# Patient Record
Sex: Female | Born: 2000 | Race: White | Hispanic: No | Marital: Single | Smoking: Never smoker
Health system: Southern US, Community
[De-identification: ages and names within clinical notes are randomized; demographics above are authoritative.]

---
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
## 2020-08-26 ENCOUNTER — Other Ambulatory Visit: Payer: Self-pay

## 2020-08-26 ENCOUNTER — Ambulatory Visit
Admission: EM | Admit: 2020-08-26 | Discharge: 2020-08-26 | Disposition: A | Discharge status: Home / Self Care | Payer: Medicaid Other | Attending: Emergency Medicine | Admitting: Emergency Medicine

## 2020-08-26 DIAGNOSIS — S91331A Puncture wound without foreign body, right foot, initial encounter: Secondary | ICD-10-CM

## 2020-08-26 MED ORDER — TETANUS-DIPHTHERIA TOXOIDS TD 5-2 LFU IM INJ: 0.5000 mL | INJECTION | Freq: Once | INTRAMUSCULAR | Status: DC

## 2020-08-26 MED ORDER — TETANUS-DIPHTH-ACELL PERTUSSIS 5-2.5-18.5 LF-MCG/0.5 IM SUSP
0.5000 mL | Freq: Once | INTRAMUSCULAR | Status: AC
  Administered 2020-08-26: 0.5 mL via INTRAMUSCULAR

## 2020-08-26 MED ORDER — LEVOFLOXACIN 500 MG PO TABS: 500.0000 mg | ORAL_TABLET | Freq: Every day | ORAL | 0 refills | Status: AC

## 2020-08-26 NOTE — ED Triage Notes (Signed)
Pt stepped on nail with right foot yesterday, needs tetanus updated

## 2020-08-26 NOTE — ED Provider Notes (Signed)
[facility]   [id number] 08/26/20 Arrival Time: 1547  CC: Puncture wound  SUBJECTIVE:  NIKAYA NASBY is a 19 y.o. female who presents with a puncture wound to RT foot that occurred 1 day ago.  Symptoms began after stepping on a nail on back deck barefoot.  Denies similar symptoms in the past.  Denies fever, chills, nausea, vomiting, redness, swelling, purulent drainage.  Td UTD: Unknown.  ROS: As per HPI.  All other pertinent ROS negative.     History reviewed. No pertinent past medical history. History reviewed. No pertinent surgical history. No Known Allergies No current facility-administered medications on file prior to encounter.   Current Outpatient Medications on File Prior to Encounter  Medication Sig Dispense Refill  . diphenhydrAMINE (BENADRYL) 25 mg capsule Take 50 mg by mouth once as needed for allergies.     Social History   Socioeconomic History  . Marital status: Single    Spouse name: Not on file  . Number of children: Not on file  . Years of education: Not on file  . Highest education level: Not on file  Occupational History  . Not on file  Tobacco Use  . Smoking status: Never Smoker  . Smokeless tobacco: Never Used  Substance and Sexual Activity  . Alcohol use: No  . Drug use: No  . Sexual activity: Not on file  Other Topics Concern  . Not on file  Social History Narrative  . Not on file   Social Determinants of Health   Financial Resource Strain:   . Difficulty of Paying Living Expenses: Not on file  Food Insecurity:   . Worried About Programme researcher, broadcasting/film/video in the Last Year: Not on file  . Ran Out of Food in the Last Year: Not on file  Transportation Needs:   . Lack of Transportation (Medical): Not on file  . Lack of Transportation (Non-Medical): Not on file  Physical Activity:   . Days of Exercise per Week: Not on file  . Minutes of Exercise per Session: Not on file  Stress:   . Feeling of Stress : Not on file  Social  Connections:   . Frequency of Communication with Friends and Family: Not on file  . Frequency of Social Gatherings with Friends and Family: Not on file  . Attends Religious Services: Not on file  . Active Member of Clubs or Organizations: Not on file  . Attends Banker Meetings: Not on file  . Marital Status: Not on file  Intimate Partner Violence:   . Fear of Current or Ex-Partner: Not on file  . Emotionally Abused: Not on file  . Physically Abused: Not on file  . Sexually Abused: Not on file   Family History  Problem Relation Age of Onset  . Asthma Mother   . Asthma Father      OBJECTIVE:  Vitals:   08/26/20 1627  BP: 117/80  Pulse: 71  Resp: 20  Temp: 98.5 F (36.9 C)  SpO2: 98%     General appearance: alert; no distress CV: Dorsalis pedis pulse 2+ Skin: superficial puncture wound to RT foot, no erythema, discharge or bleeding Psychological: alert and cooperative; normal mood and affect  ASSESSMENT & PLAN:  1. Puncture wound of right foot, initial encounter     Meds ordered this encounter  Medications  . DISCONTD: tetanus & diphtheria toxoids (adult) (TENIVAC) injection 0.5 mL  . levofloxacin (LEVAQUIN) 500 MG tablet    Sig: Take 1 tablet (  500 mg total) by mouth daily.    Dispense:  5 tablet    Refill:  0    Order Specific Question:   Supervising Provider    Answer:   Eustace Moore [[id number]]  . Tdap (BOOSTRIX) injection 0.5 mL    Tetanus updated Antibiotic prescribed Clean with soap and water  Change dressing daily and apply a thin layer of neosporin.  Take OTC ibuprofen or tylenol as needed for pain relief Return or go to the ED if you have any new or worsening symptoms such as increased pain, redness, swelling, drainage, discharge, decreased range of motion of extremity, etc..     Reviewed expectations re: course of current medical issues. Questions answered. Outlined signs and symptoms indicating need for more acute  intervention. Patient verbalized understanding. After Visit Summary given.   Rennis Harding, PA-C 08/26/20 1649

## 2020-08-26 NOTE — Discharge Instructions (Signed)
Tetanus updated Antibiotic prescribed Clean with soap and water  Change dressing daily and apply a thin layer of neosporin.  Take OTC ibuprofen or tylenol as needed for pain relief Return or go to the ED if you have any new or worsening symptoms such as increased pain, redness, swelling, drainage, discharge, decreased range of motion of extremity, etc..

## 2020-11-26 ENCOUNTER — Encounter: Payer: Self-pay | Admitting: Emergency Medicine

## 2020-11-26 ENCOUNTER — Other Ambulatory Visit: Payer: Self-pay

## 2020-11-26 ENCOUNTER — Ambulatory Visit
Admission: EM | Admit: 2020-11-26 | Discharge: 2020-11-26 | Disposition: A | Discharge status: Home / Self Care | Payer: Medicaid Other | Attending: Emergency Medicine | Admitting: Emergency Medicine

## 2020-11-26 DIAGNOSIS — L0291 Cutaneous abscess, unspecified: Secondary | ICD-10-CM | POA: Diagnosis not present

## 2020-11-26 MED ORDER — DOXYCYCLINE HYCLATE 100 MG PO CAPS: 100.0000 mg | ORAL_CAPSULE | Freq: Two times a day (BID) | ORAL | 0 refills | Status: AC

## 2020-11-26 NOTE — ED Provider Notes (Signed)
[facility]   [id number] 11/26/20 Arrival Time: 0940   [id number]  SUBJECTIVE:  Claudia Ruiz is a 20 y.o. female with history of oral presented to the urgent care for complaint of abscess to right upper tight for the past 1 month.  Denies any aggravating event. Has tried OTC medication without relief. Alleviating or aggravating factors. Reports similar symptoms in the past that improved with antibiotic treatment. Denies chills, fever, nausea, vomiting, diarrhea.  ROS: As per HPI.  All other pertinent ROS negative.     History reviewed. No pertinent past medical history. History reviewed. No pertinent surgical history. No Known Allergies No current facility-administered medications on file prior to encounter.   Current Outpatient Medications on File Prior to Encounter  Medication Sig Dispense Refill  . diphenhydrAMINE (BENADRYL) 25 mg capsule Take 50 mg by mouth once as needed for allergies.    Marland Kitchen levofloxacin (LEVAQUIN) 500 MG tablet Take 1 tablet (500 mg total) by mouth daily. 5 tablet 0   Social History   Socioeconomic History  . Marital status: Single    Spouse name: Not on file  . Number of children: Not on file  . Years of education: Not on file  . Highest education level: Not on file  Occupational History  . Not on file  Tobacco Use  . Smoking status: Never Smoker  . Smokeless tobacco: Never Used  Vaping Use  . Vaping Use: Never used  Substance and Sexual Activity  . Alcohol use: No  . Drug use: No  . Sexual activity: Yes    Birth control/protection: None  Other Topics Concern  . Not on file  Social History Narrative  . Not on file   Social Determinants of Health   Financial Resource Strain: Not on file  Food Insecurity: Not on file  Transportation Needs: Not on file  Physical Activity: Not on file  Stress: Not on file  Social Connections: Not on file  Intimate Partner Violence: Not on file   Family History  Problem Relation Age of  Onset  . Asthma Mother   . Asthma Father     OBJECTIVE:  Vitals:   11/26/20 1037 11/26/20 1040  BP:  107/64  Pulse:  (!) 101  Resp:  18  Temp:  98.3 F (36.8 C)  TempSrc:  Oral  Weight: 147 lb (66.7 kg)   Height: 5\' 2"  (1.575 m)      General appearance: alert; no distress Heart: RRR, no rub, gallop or murmur Chest: CTA, heart sounds normal Skin: 2 cm induration of her posterior upper tight; tender to touch;  active bloody drainage Psychological: alert and cooperative; normal mood and affect    ASSESSMENT & PLAN:  1. Abscess     Meds ordered this encounter  Medications  . doxycycline (VIBRAMYCIN) 100 MG capsule    Sig: Take 1 capsule (100 mg total) by mouth 2 (two) times daily.    Dispense:  20 capsule    Refill:  0    Discharge instructions  Apply warm compresses 3-4x daily for 10-15 minutes Wash site daily with warm water and mild soap May keep it covered to avoid friction Take antibiotic as prescribed and to completion Follow up here or with PCP if symptoms persists Return or go to the ED if you have any new or worsening symptoms increased redness, swelling, pain, nausea, vomiting, fever, chills, etc...    Reviewed expectations re: course of current medical issues. Questions answered. Outlined signs and symptoms indicating  need for more acute intervention. Patient verbalized understanding. After Visit Summary given.          Durward Parcel, FNP 11/26/20 1137

## 2020-11-26 NOTE — Discharge Instructions (Signed)
  Apply warm compresses 3-4x daily for 10-15 minutes Wash site daily with warm water and mild soap May keep it covered to avoid friction Take antibiotic as prescribed and to completion Follow up here or with PCP if symptoms persists Return or go to the ED if you have any new or worsening symptoms increased redness, swelling, pain, nausea, vomiting, fever, chills, etc..

## 2020-11-26 NOTE — ED Triage Notes (Signed)
Pt c/o of pain to right upper leg x 1 mo. She believes it is a "boil".
